# Patient Record
Sex: Male | Born: 1965 | Race: Black or African American | Hispanic: No | Marital: Married
Health system: Southern US, Community
[De-identification: ages and names within clinical notes are randomized; demographics above are authoritative.]

## PROBLEM LIST (undated history)

## (undated) DIAGNOSIS — M1990 Unspecified osteoarthritis, unspecified site: Secondary | ICD-10-CM

---

## 2017-05-25 ENCOUNTER — Emergency Department
Admission: EM | Admit: 2017-05-25 | Discharge: 2017-05-25 | Disposition: A | Discharge status: Home / Self Care | Payer: Federal, State, Local not specified - PPO | Attending: Emergency Medicine | Admitting: Emergency Medicine

## 2017-05-25 ENCOUNTER — Emergency Department: Payer: Federal, State, Local not specified - PPO

## 2017-05-25 ENCOUNTER — Other Ambulatory Visit: Payer: Self-pay

## 2017-05-25 ENCOUNTER — Encounter: Payer: Self-pay | Admitting: Medical Oncology

## 2017-05-25 DIAGNOSIS — R079 Chest pain, unspecified: Secondary | ICD-10-CM | POA: Insufficient documentation

## 2017-05-25 HISTORY — DX: Unspecified osteoarthritis, unspecified site: M19.90

## 2017-05-25 LAB — BASIC METABOLIC PANEL
Anion gap: 6 (ref 5–15)
BUN: 12 mg/dL (ref 6–20)
CALCIUM: 9.3 mg/dL (ref 8.9–10.3)
CO2: 27 mmol/L (ref 22–32)
CREATININE: 0.79 mg/dL (ref 0.61–1.24)
Chloride: 105 mmol/L (ref 101–111)
GFR calc non Af Amer: >60 mL/min (ref >60)
Glucose, Bld: 87 mg/dL (ref 65–99)
Potassium: 3.4 mmol/L — ABNORMAL LOW (ref 3.5–5.1)
Sodium: 138 mmol/L (ref 135–145)

## 2017-05-25 LAB — CBC
HCT: 43.4 % (ref 40.0–52.0)
Hemoglobin: 14.6 g/dL (ref 13.0–18.0)
MCH: 29.5 pg (ref 26.0–34.0)
MCHC: 33.6 g/dL (ref 32.0–36.0)
MCV: 87.7 fL (ref 80.0–100.0)
PLATELETS: 196 10*3/uL (ref 150–440)
RBC: 4.95 MIL/uL (ref 4.40–5.90)
RDW: 13.6 % (ref 11.5–14.5)
WBC: 6.8 10*3/uL (ref 3.8–10.6)

## 2017-05-25 LAB — TROPONIN I: Troponin I: <0.03 ng/mL (ref <0.03)

## 2017-05-25 IMAGING — CR DG CHEST 2V
1 series · 2 of 2 positions shown · non-contrast
Comparison: None.

CLINICAL DATA: Pt stated that he was experiencing pain in the
middle and left side of his chest that started about 3 days ago.

EXAM:
CHEST  2 VIEW

[Series 1: dg chest 2 view · 0.14mm/px · 2 of 2 slices shown]
[im 1/2]
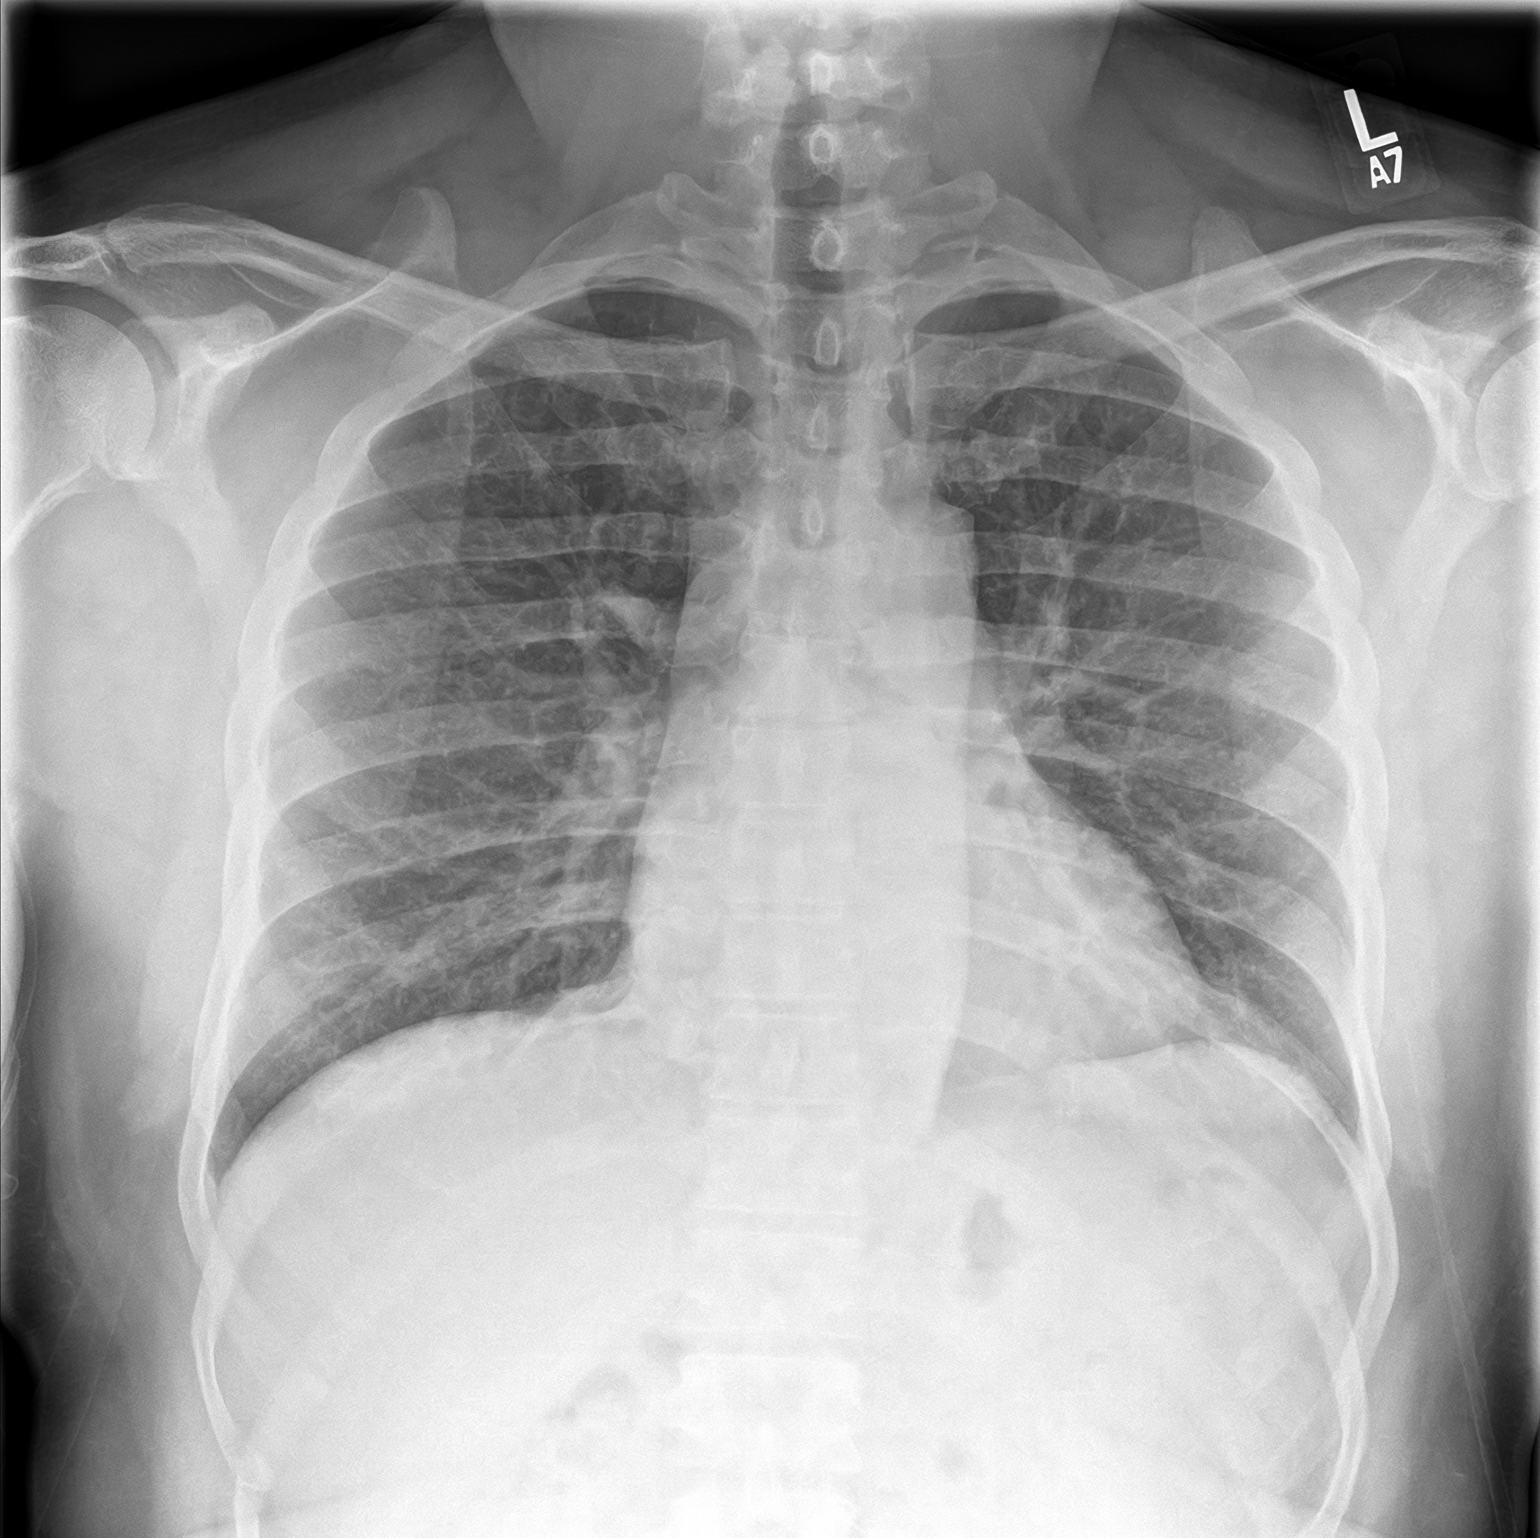
[im 2/2]
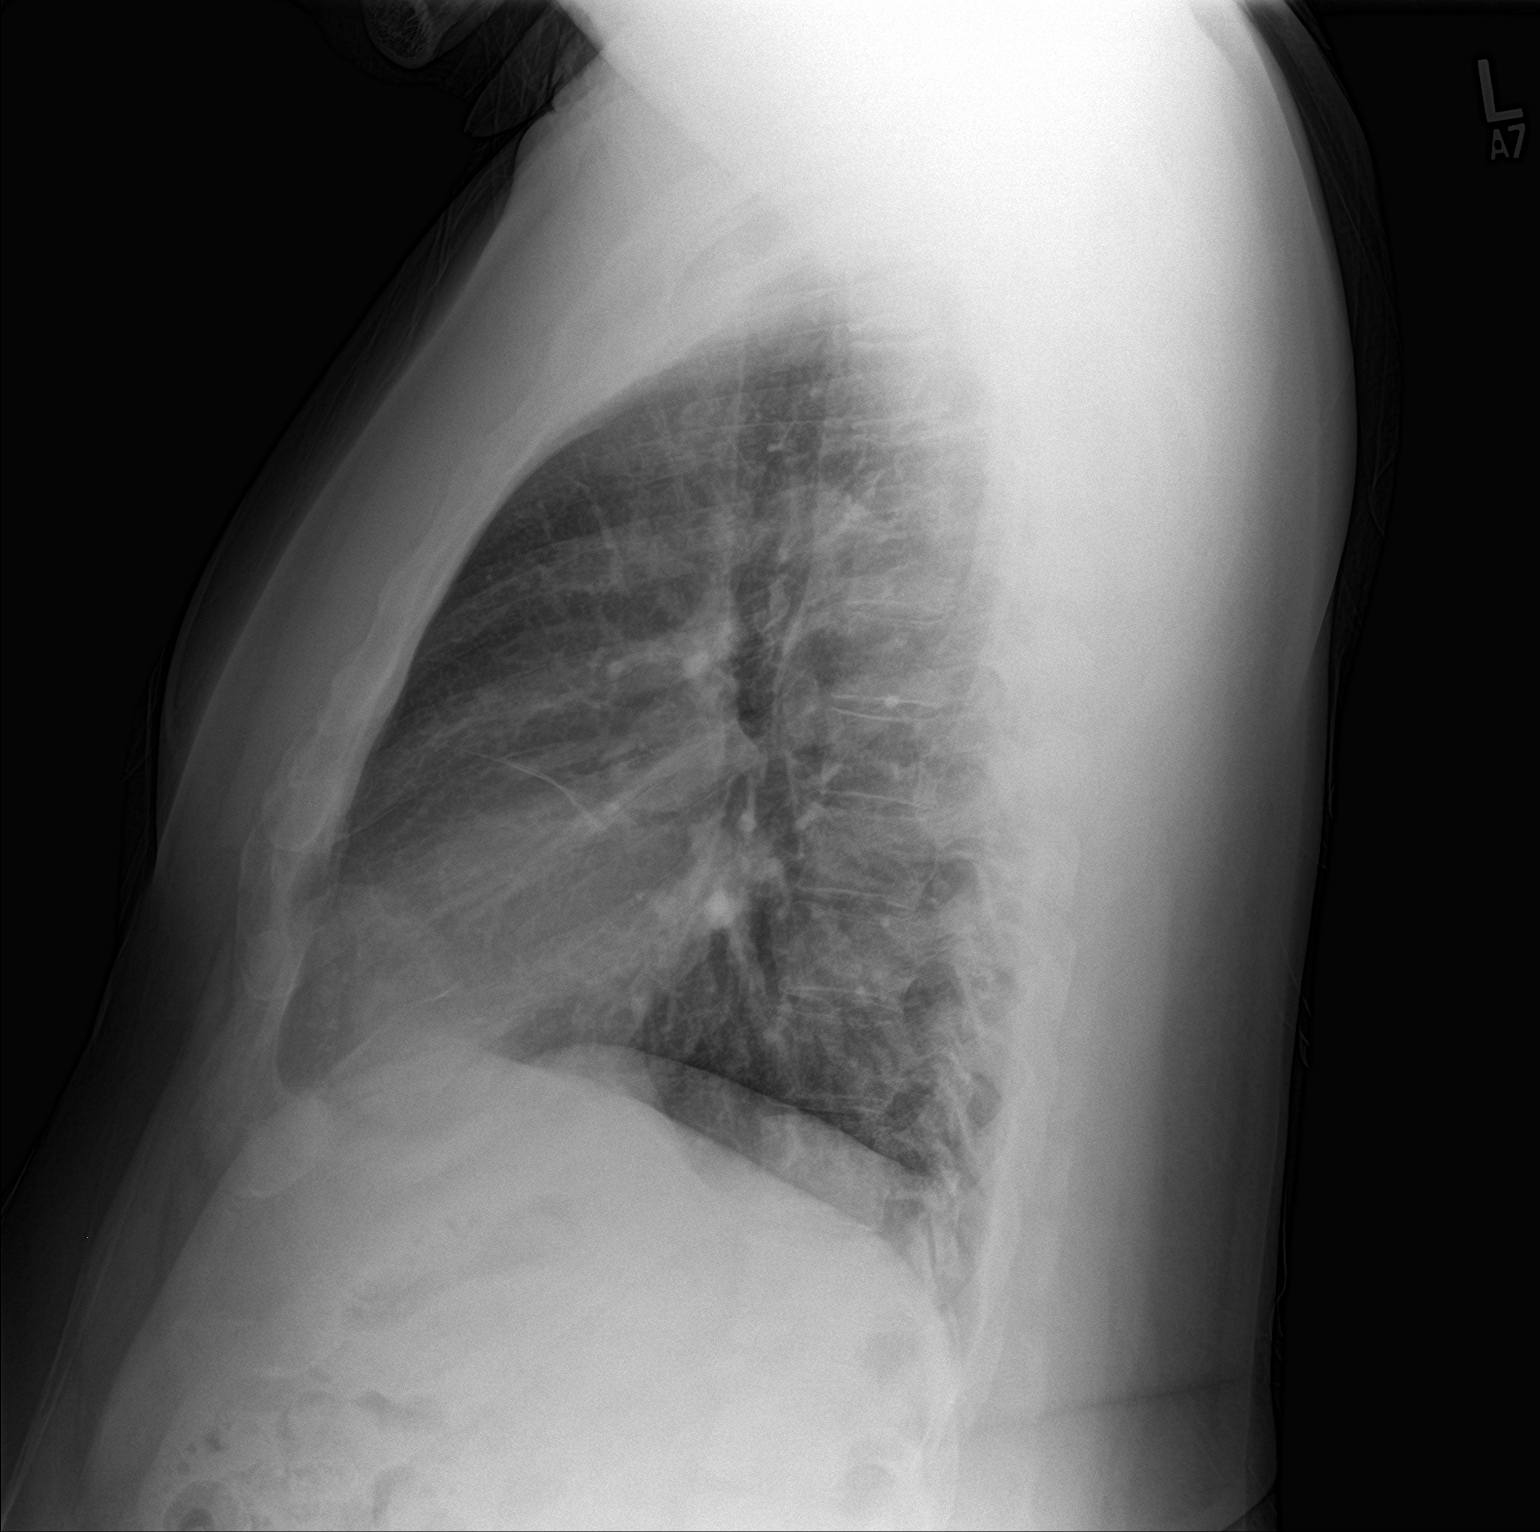

[2 of 2 positions shown; findings below may reference images not displayed]

FINDINGS: Cardiomediastinal silhouette is normal in size and configuration.
Lungs are clear. Lung volumes are normal. No evidence of pneumonia.
No pleural effusion. No pneumothorax.

Osseous and soft tissue structures about the chest are unremarkable.
IMPRESSION: No active cardiopulmonary disease. No evidence of pneumonia or
pulmonary edema.

## 2017-05-25 MED ORDER — ASPIRIN 81 MG PO TBEC: 81.0000 mg | DELAYED_RELEASE_TABLET | Freq: Every day | ORAL | 12 refills | Status: AC

## 2017-05-25 NOTE — ED Triage Notes (Signed)
Pt reports pain to center of chest that began about 3 days ago. Pain worsens with movement. Pt denies sob. Pt in NAD.

## 2017-05-25 NOTE — ED Provider Notes (Signed)
[facility] Emergency Department Provider Note       Time seen: ----------------------------------------- 2:19 PM on 05/25/2017 -----------------------------------------     I have reviewed the triage vital signs and the nursing notes.   HISTORY   Chief Complaint Chest Pain    HPI Dwayne Hudson is a 51 y.o. male who presents to the ED for chest pain that began about 3 days ago. Pain was worse last night, he describes it as an ache. Currently the pain is 3 out of 10 and is worse with movement. He denies sweats, nausea, shortness of breath. He has never had a history of this before.   Past Medical History:  Diagnosis Date  . Arthritis     There are no active problems to display for this patient.   No past surgical history on file.  Allergies Patient has no known allergies.  Social History Social History  Substance Use Topics  . Smoking status: Not on file  . Smokeless tobacco: Not on file  . Alcohol use Not on file    Review of Systems Constitutional: Negative for fever. Eyes: Negative for vision changes ENT:  Negative for congestion, sore throat Cardiovascular: Positive for chest pain Respiratory: Negative for shortness of breath. Gastrointestinal: Negative for abdominal pain, vomiting and diarrhea. Genitourinary: Negative for dysuria. Musculoskeletal: Negative for back pain. Skin: Negative for rash. Neurological: Negative for headaches, focal weakness or numbness.  All systems negative/normal/unremarkable except as stated in the HPI  ____________________________________________   PHYSICAL EXAM:  VITAL SIGNS: ED Triage Vitals [05/25/17 1155]  Enc Vitals Group     BP (!) 134/92     Pulse Rate 62     Resp 18     Temp 97.8 F (36.6 C)     Temp Source Oral     SpO2 98 %     Weight 245 lb (111.1 kg)     Height 5\' 9"  (1.753 m)     Head Circumference      Peak Flow      Pain Score 4     Pain Loc      Pain Edu?      Excl.  in GC?    Constitutional: Alert and oriented. Well appearing and in no distress. Eyes: Conjunctivae are normal. Normal extraocular movements. ENT   Head: Normocephalic and atraumatic.   Nose: No congestion/rhinnorhea.   Mouth/Throat: Mucous membranes are moist.   Neck: No stridor. Cardiovascular: Normal rate, regular rhythm. No murmurs, rubs, or gallops. Respiratory: Normal respiratory effort without tachypnea nor retractions. Breath sounds are clear and equal bilaterally. No wheezes/rales/rhonchi. Gastrointestinal: Soft and nontender. Normal bowel sounds Musculoskeletal: Nontender with normal range of motion in extremities. No lower extremity tenderness nor edema. Neurologic:  Normal speech and language. No gross focal neurologic deficits are appreciated.  Skin:  Skin is warm, dry and intact. No rash noted. Psychiatric: Mood and affect are normal. Speech and behavior are normal.  ____________________________________________  EKG: Interpreted by me. Sinus bradycardia rate of 49 bpm, nonspecific ST segment changes in 1 and aVL, normal QRS, normal QT.  Repeat EKG interpreted by me is unchanged. Likely early repolarization in 1 and aVL, sinus rhythm with a rate of 51 bpm, prolonged PR interval, normal QRS ____________________________________________  ED COURSE:  Pertinent labs & imaging results that were available during my care of the patient were reviewed by me and considered in my medical decision making (see chart for details). Patient presents for chest pain, we will assess with  labs and imaging as indicated.   Procedures ____________________________________________   LABS (pertinent positives/negatives)  Labs Reviewed  BASIC METABOLIC PANEL - Abnormal; Notable for the following:       Result Value   Potassium 3.4 (*)    All other components within normal limits  CBC  TROPONIN I  TROPONIN I    RADIOLOGY Images were viewed by me  Chest x-ray IMPRESSION: No  active cardiopulmonary disease. No evidence of pneumonia or pulmonary edema. ____________________________________________  FINAL ASSESSMENT AND PLAN  Chest pain  Plan: Patient's labs and imaging were dictated above. Patient had presented for Chest pain of uncertain etiology. He likely has early repolarization on his EKG. He'll be referred to cardiology for outpatient follow-up.   Emily FilbertWilliams, Carolynn Tuley E, MD   Note: This note was generated in part or whole with voice recognition software. Voice recognition is usually quite accurate but there are transcription errors that can and very often do occur. I apologize for any typographical errors that were not detected and corrected.     Emily FilbertWilliams, Diella Gillingham E, MD 05/25/17 [id number]

## 2017-05-25 NOTE — ED Notes (Signed)
ED Provider at bedside. 

## 2017-07-25 ENCOUNTER — Ambulatory Visit: Payer: Federal, State, Local not specified - PPO | Admitting: Internal Medicine

## 2017-07-25 NOTE — Progress Notes (Deleted)
   New Outpatient Visit Date: 07/25/2017  Referring Provider: Daryel NovemberJonathan Williams, MD [facility] Emergency Department  Chief Complaint: Chest pain  HPI:  [name] is a 51 y.o. male who is being seen today for the evaluation of chest pain at the request of Dr. Mayford KnifeWilliams. He has a history of arthritis. [facility] on 05/25/17 with a 3-day history of chest pain without accompanying symptoms. EKG showed lateral ST segment elevation consistent with early repolarization. Troponin was negative x 2. No clear etiology for his chest pain was identified.  --------------------------------------------------------------------------------------------------  Cardiovascular History & Procedures: Cardiovascular Problems:  Chest pain  Risk Factors:  None  Cath/PCI:  None  CV Surgery:  None  EP Procedures and Devices:  None  Non-Invasive Evaluation(s):  None  Recent CV Pertinent Labs: Lab Results  Component Value Date   K 3.4 (L) 05/25/2017   BUN 12 05/25/2017   CREATININE 0.79 05/25/2017    --------------------------------------------------------------------------------------------------  Past Medical History:  Diagnosis Date  . Arthritis     No past surgical history on file.  No outpatient prescriptions have been marked as taking for the 07/25/17 encounter (Appointment) with Leoda Smithhart, Cristal Deerhristopher, MD.    Allergies: Patient has no known allergies.  Social History   Social History  . Marital status: Married    Spouse name: N/A  . Number of children: N/A  . Years of education: N/A   Occupational History  . Not on file.   Social History Main Topics  . Smoking status: Not on file  . Smokeless tobacco: Not on file  . Alcohol use Not on file  . Drug use: Unknown  . Sexual activity: Not on file   Other Topics Concern  . Not on file   Social History Narrative  . No narrative on file    No family history on file.  Review of Systems: A 12-system review of  systems was performed and was negative except as noted in the HPI.  --------------------------------------------------------------------------------------------------  Physical Exam: There were no vitals taken for this visit.  General:  *** HEENT: No conjunctival pallor or scleral icterus. Moist mucous membranes. OP clear. Neck: Supple without lymphadenopathy, thyromegaly, JVD, or HJR. No carotid bruit. Lungs: Normal work of breathing. Clear to auscultation bilaterally without wheezes or crackles. Heart: Regular rate and rhythm without murmurs, rubs, or gallops. Non-displaced PMI. Abd: Bowel sounds present. Soft, NT/ND without hepatosplenomegaly Ext: No lower extremity edema. Radial, PT, and DP pulses are 2+ bilaterally Skin: Warm and dry without rash. Neuro: CNIII-XII intact. Strength and fine-touch sensation intact in upper and lower extremities bilaterally. Psych: Normal mood and affect.  EKG:  ***  Lab Results  Component Value Date   WBC 6.8 05/25/2017   HGB 14.6 05/25/2017   HCT 43.4 05/25/2017   MCV 87.7 05/25/2017   PLT 196 05/25/2017    Lab Results  Component Value Date   NA 138 05/25/2017   K 3.4 (L) 05/25/2017   CL 105 05/25/2017   CO2 27 05/25/2017   BUN 12 05/25/2017   CREATININE 0.79 05/25/2017   GLUCOSE 87 05/25/2017    No results found for: CHOL, HDL, LDLCALC, LDLDIRECT, TRIG, CHOLHDL   --------------------------------------------------------------------------------------------------  ASSESSMENT AND PLAN: ***  [name], MD 07/25/2017 8:46 AM

## 2017-07-26 ENCOUNTER — Encounter: Payer: Self-pay | Admitting: Internal Medicine

## 2022-02-13 ENCOUNTER — Other Ambulatory Visit: Payer: Self-pay | Admitting: Internal Medicine

## 2022-02-13 DIAGNOSIS — M25511 Pain in right shoulder: Secondary | ICD-10-CM

## 2022-02-13 DIAGNOSIS — M25512 Pain in left shoulder: Secondary | ICD-10-CM

## 2022-02-23 ENCOUNTER — Ambulatory Visit
Admission: RE | Admit: 2022-02-23 | Discharge: 2022-02-23 | Disposition: A | Discharge status: Home / Self Care | Payer: No Typology Code available for payment source | Source: Ambulatory Visit | Attending: Internal Medicine | Admitting: Internal Medicine

## 2022-02-23 ENCOUNTER — Other Ambulatory Visit: Payer: Federal, State, Local not specified - PPO

## 2022-02-23 DIAGNOSIS — M25511 Pain in right shoulder: Secondary | ICD-10-CM | POA: Insufficient documentation

## 2022-02-23 DIAGNOSIS — M25512 Pain in left shoulder: Secondary | ICD-10-CM | POA: Insufficient documentation

## 2022-02-23 IMAGING — MR MR SHOULDER*L* W/O CM
5 of 6 series · 30 of 40 positions shown · non-contrast
Comparison: None.

CLINICAL DATA: Left shoulder pain with limited range of motion
since [4H], and notes pain is getting worse.

EXAM:
MRI OF THE LEFT SHOULDER WITHOUT CONTRAST
TECHNIQUE: Multiplanar, multisequence MR imaging of the shoulder was performed.
No intravenous contrast was administered.

[Series 5: T2 fat-sat · axial · left · 4.0mm · 0.44mm/px · z∈[-53,+67]mm · 7 of 26 slices shown (1 of 4)]
[im 1/26]
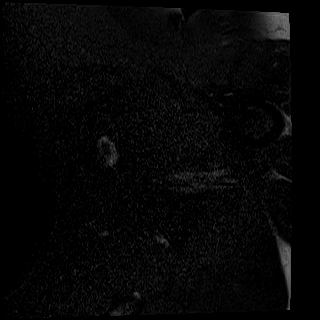
[im 5/26]
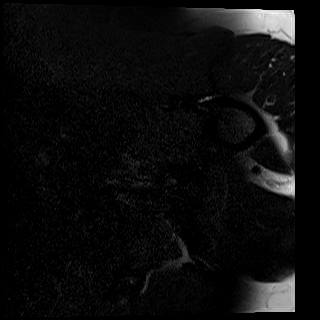
[im 9/26]
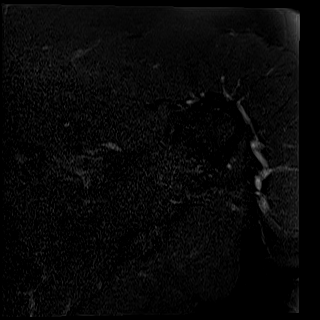
[im 13/26]
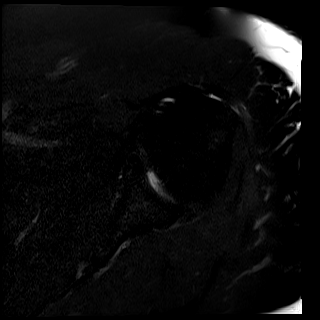
[im 17/26]
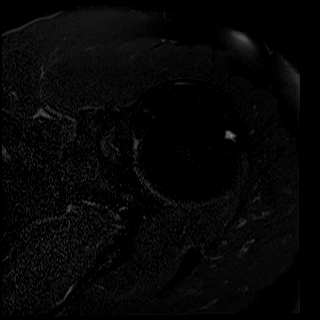
[im 21/26]
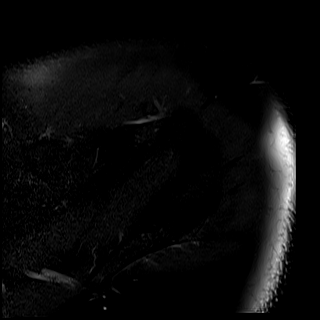
[im 26/26]
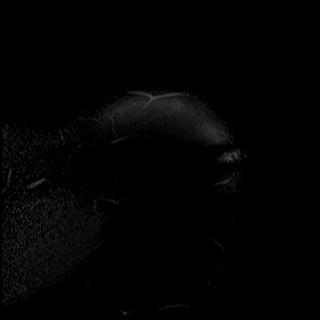

[Series 6: PD · oblique · left · 4.0mm · 0.44mm/px · 7 of 26 slices shown]
[im 1/26]
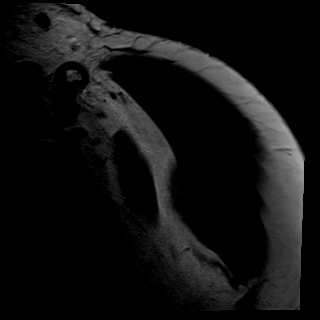
[im 5/26]
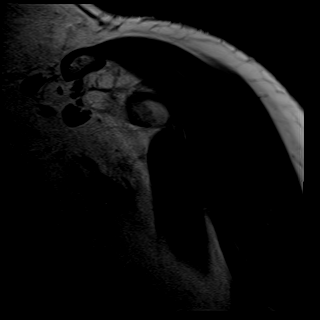
[im 9/26]
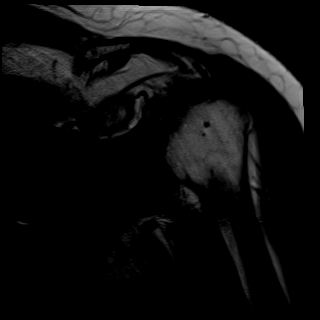
[im 13/26]
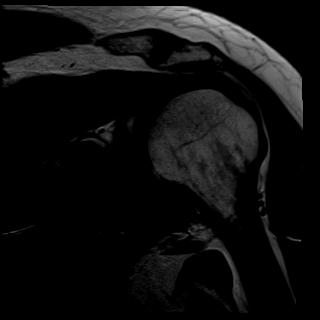
[im 17/26]
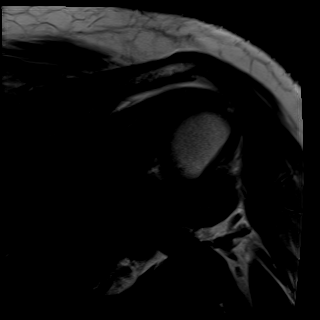
[im 21/26]
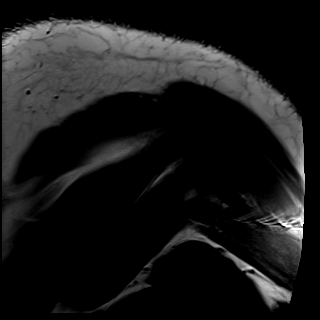
[im 26/26]
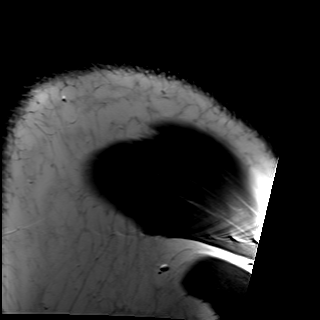

[Series 7: T2 fat-sat · oblique · left · 4.0mm · 0.44mm/px · 7 of 26 slices shown (2 of 4)]
[im 1/26]
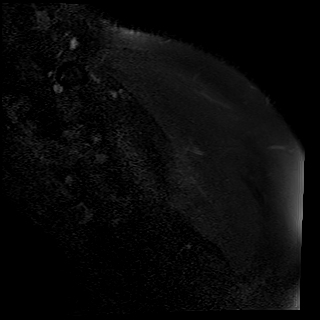
[im 5/26]
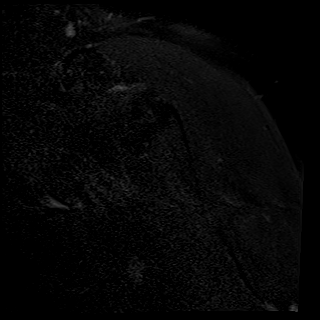
[im 9/26]
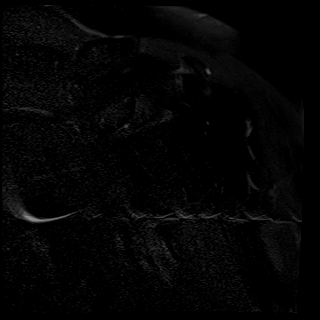
[im 13/26]
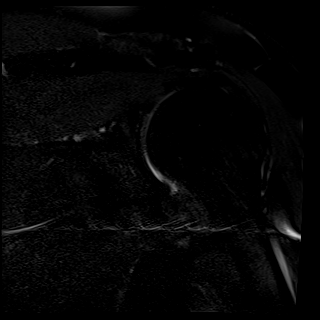
[im 17/26]
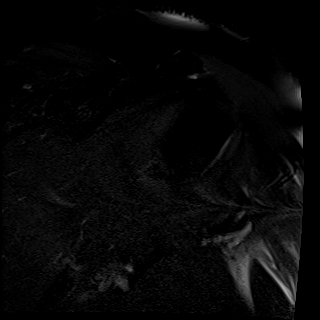
[im 21/26]
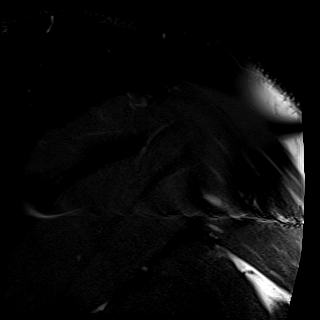
[im 26/26]
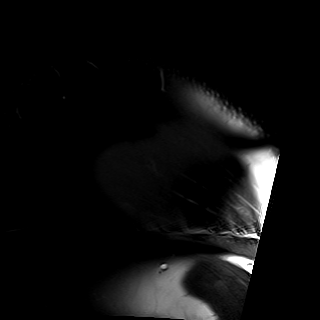

[Series 8: T2 fat-sat · oblique · left · 4.0mm · 0.22mm/px · 6 of 22 slices shown (3 of 4)]
[im 1/22]
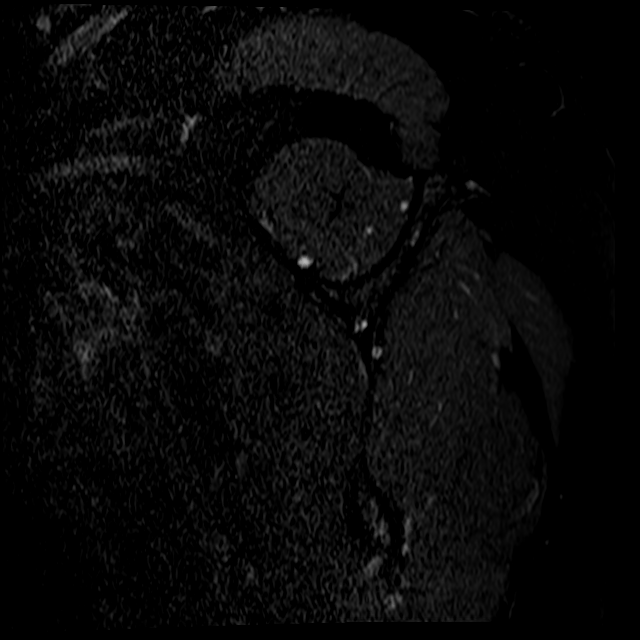
[im 5/22]
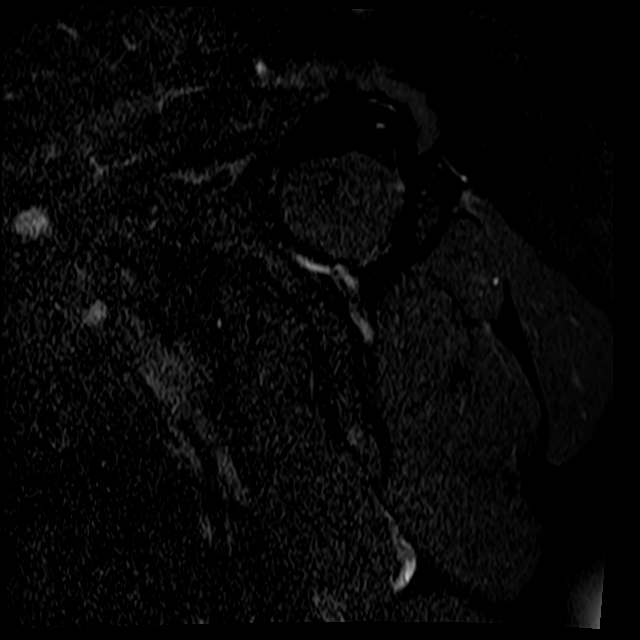
[im 9/22]
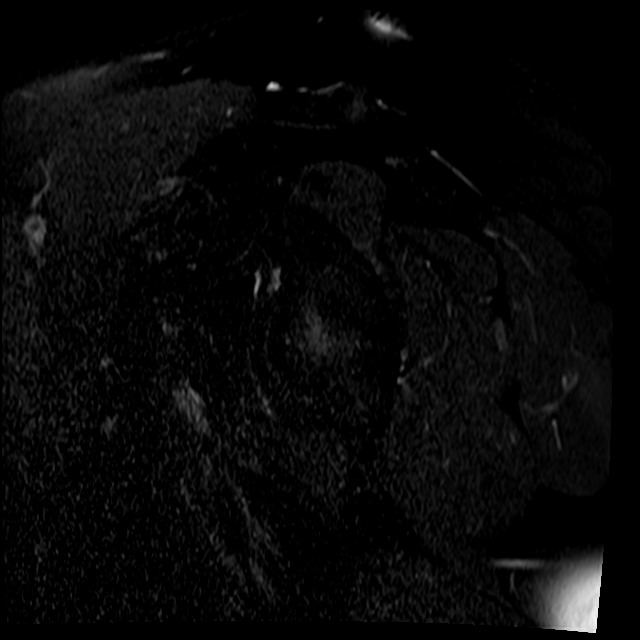
[im 13/22]
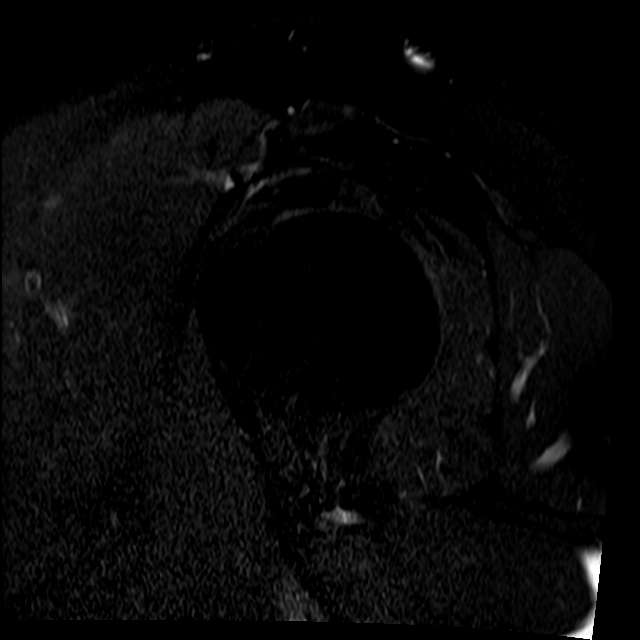
[im 17/22]
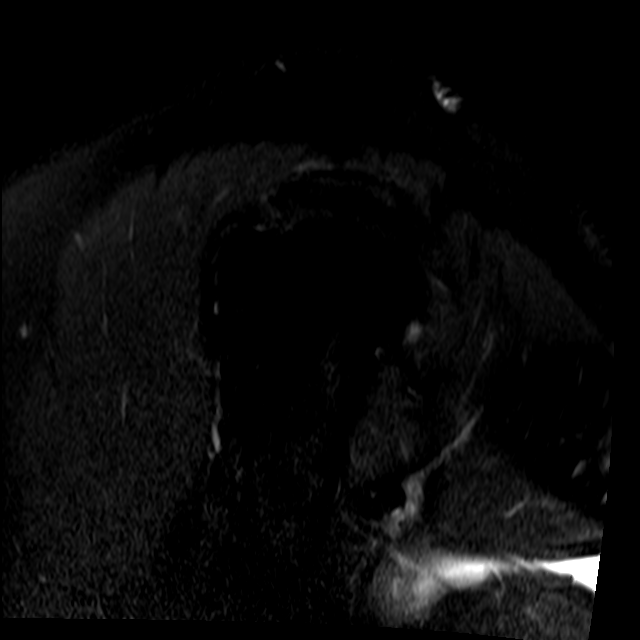
[im 22/22]
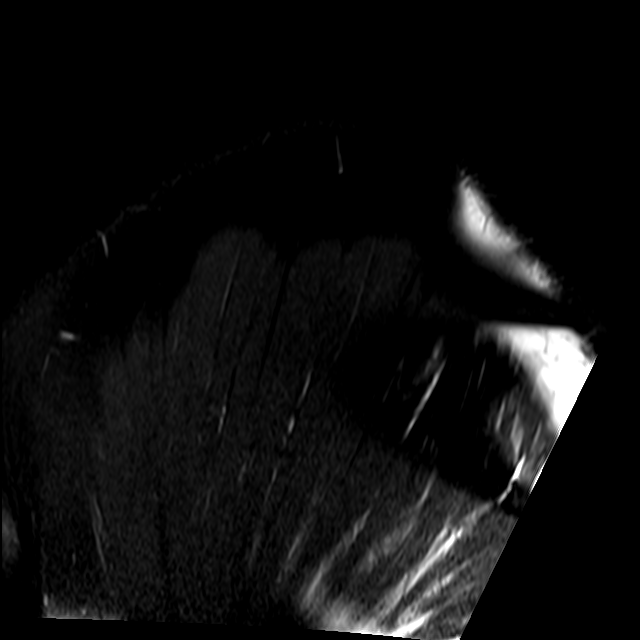

[Series 10: T2 fat-sat · oblique · left · 4.0mm · 0.44mm/px · 3 of 26 slices shown (4 of 4)]
[im 1/26]
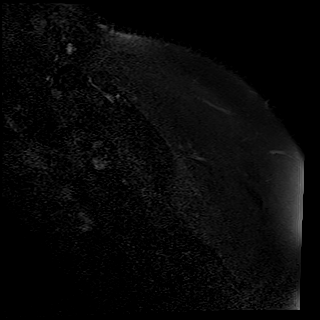
[im 5/26]
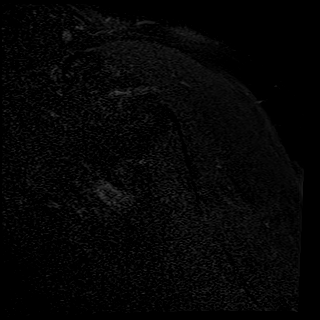
[im 9/26]
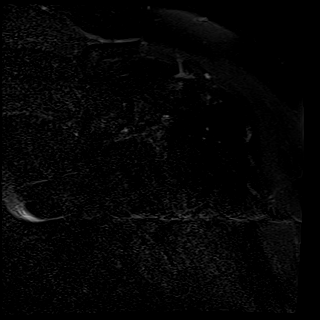

[30 of 40 positions shown; findings below may reference images not displayed]

FINDINGS: Rotator cuff: There is mild intermediate T2 signal tendinosis of the
posterior supraspinatus and anterior infraspinatus tendon fibers.
There is mild cystic change within the humeral head deep to the
anterior supraspinatus tendon insertion with likely minimal
partial-thickness degenerative fraying of the anterior supraspinatus
tendon (sagittal images 16 and 17, coronal image 17). The
infraspinatus, subscapularis, and teres minor are intact.

Muscles: No rotator cuff muscle atrophy, fatty infiltration, or
edema.

Biceps long head: The intra-articular long head of the biceps tendon
is intact.

Acromioclavicular Joint: There are minimal degenerative changes of
the acromioclavicular joint including joint space narrowing,
subchondral marrow edema, and peripheral osteophytosis. Type II
acromion. Minimal distal lateral subacromial spurring. No
subacromial/subdeltoid bursitis.

Glenohumeral Joint: Mild glenoid and humeral head cartilage
thinning.

Labrum: Grossly intact, but evaluation is limited by lack of
intraarticular fluid.

Bones:  No acute fracture.

Other: None.
IMPRESSION: :
IMPRESSION: 1. Mild posterior supraspinatus and anterior infraspinatus
insertional tendinosis. Minimal partial-thickness degenerative
fraying of the anterior supraspinatus tendon footprint.
2. Minimal degenerative changes of the acromioclavicular joint.

## 2022-02-23 IMAGING — MR MR SHOULDER*R* W/O CM
4 of 5 series · 31 of 40 positions shown · non-contrast
Comparison: None.

CLINICAL DATA: Right shoulder pain with limited range of motion
since [ZF], and notes pain is getting worse.

EXAM:
MRI OF THE RIGHT SHOULDER WITHOUT CONTRAST
TECHNIQUE: Multiplanar, multisequence MR imaging of the shoulder was performed.
No intravenous contrast was administered.

[Series 5: T2 fat-sat · axial · right · 4.0mm · 0.44mm/px · z∈[-48,+72]mm · 8 of 26 slices shown (1 of 3)]
[im 1/26]
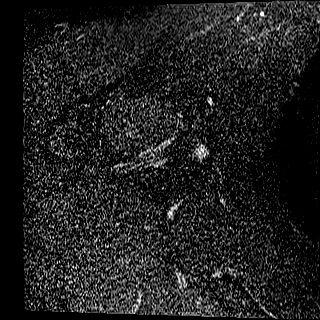
[im 4/26]
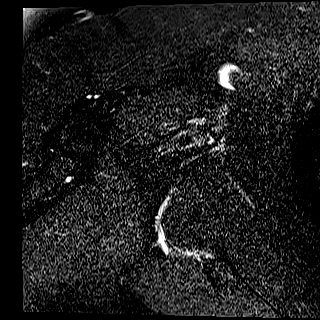
[im 8/26]
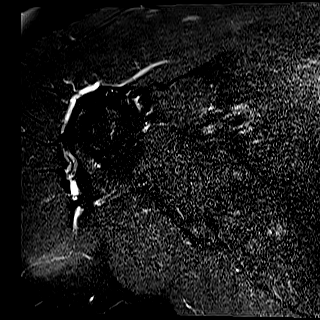
[im 11/26]
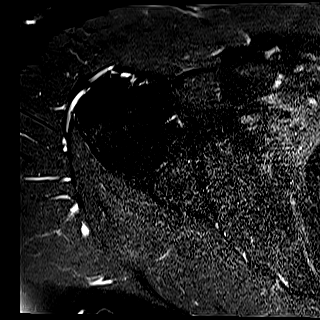
[im 15/26]
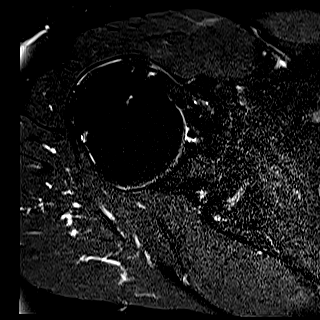
[im 18/26]
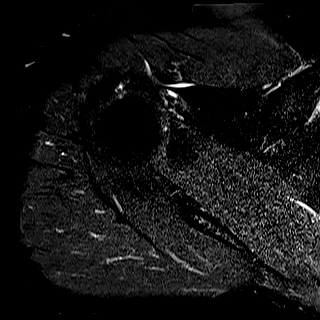
[im 22/26]
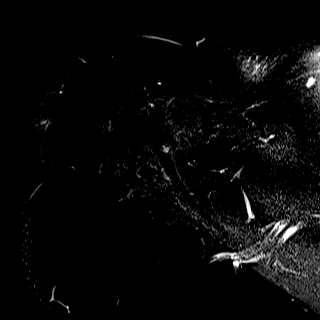
[im 26/26]
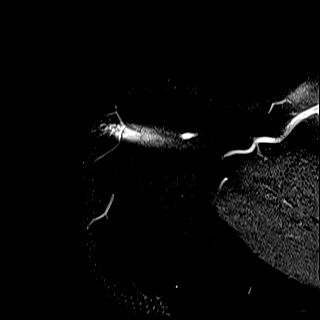

[Series 6: PD · oblique · right · 4.0mm · 0.44mm/px · 9 of 26 slices shown]
[im 1/26]
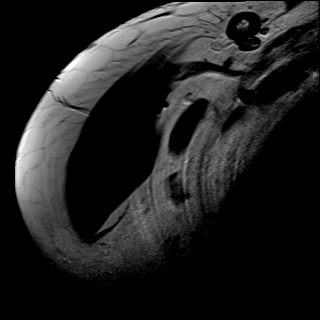
[im 4/26]
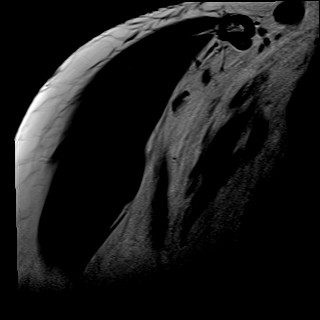
[im 7/26]
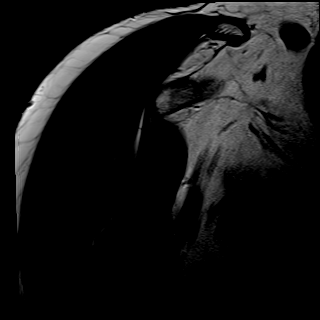
[im 10/26]
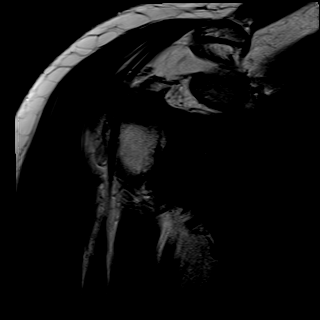
[im 13/26]
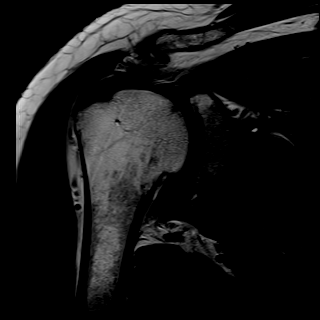
[im 16/26]
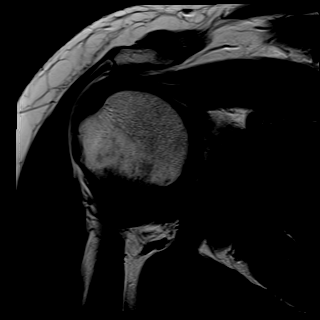
[im 19/26]
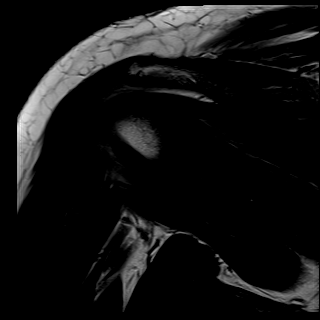
[im 22/26]
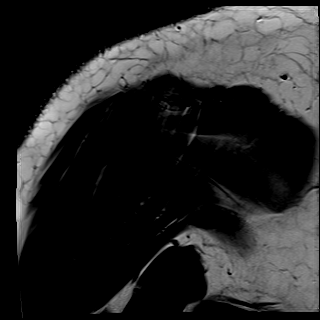
[im 26/26]
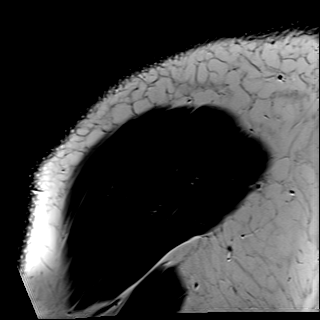

[Series 7: T2 fat-sat · oblique · right · 4.0mm · 0.44mm/px · 9 of 26 slices shown (2 of 3)]
[im 1/26]
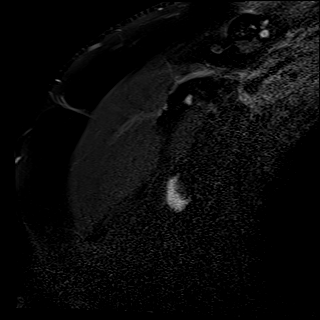
[im 4/26]
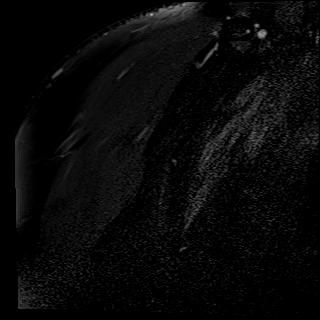
[im 7/26]
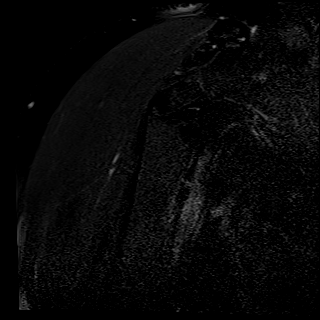
[im 10/26]
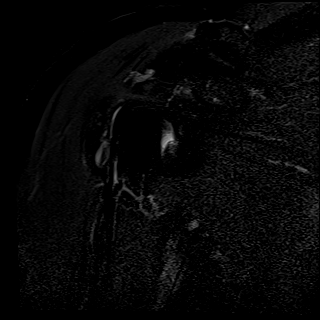
[im 13/26]
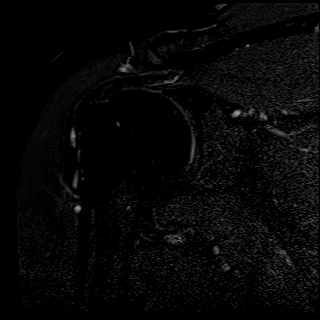
[im 16/26]
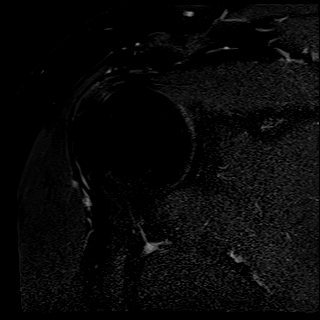
[im 19/26]
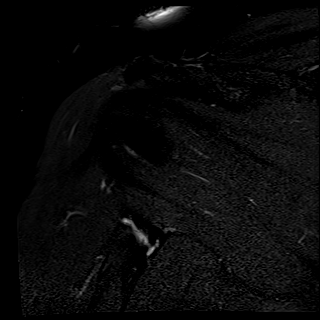
[im 22/26]
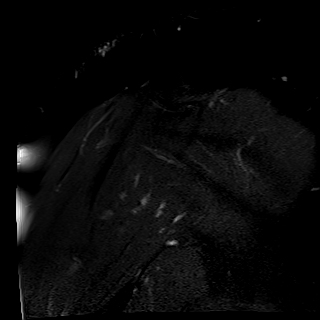
[im 26/26]
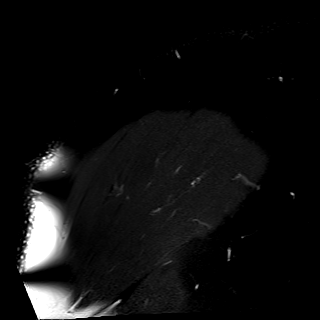

[Series 8: T2 fat-sat · oblique · right · 4.0mm · 0.22mm/px · 5 of 22 slices shown (3 of 3)]
[im 1/22]
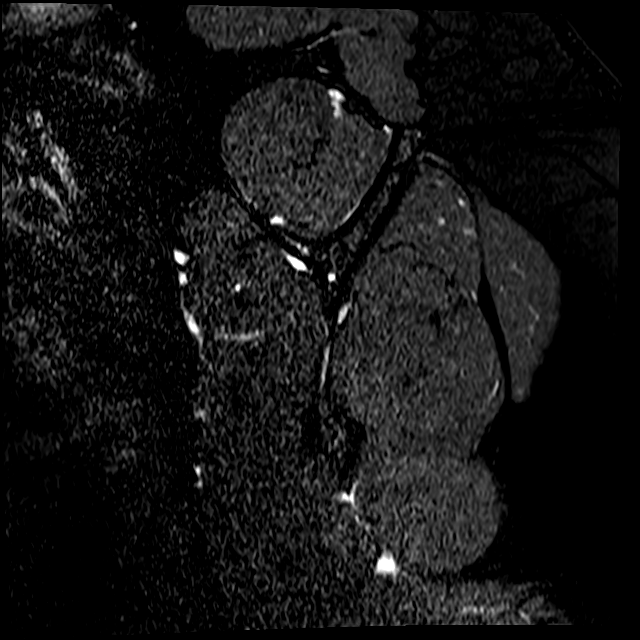
[im 4/22]
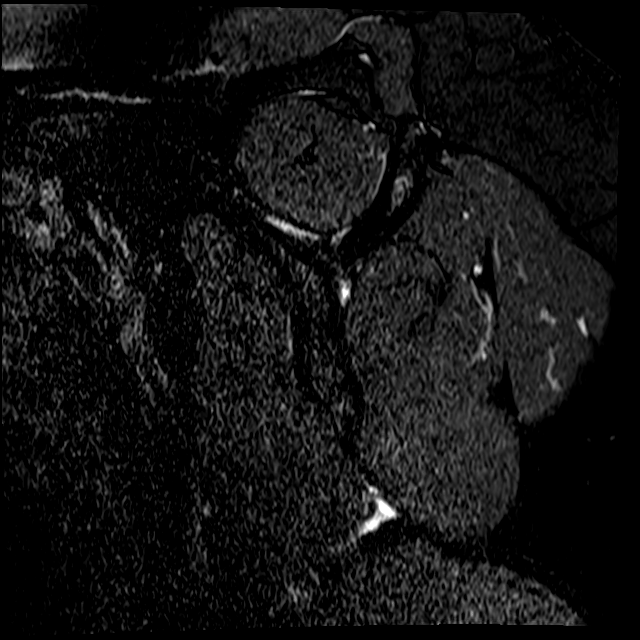
[im 8/22]
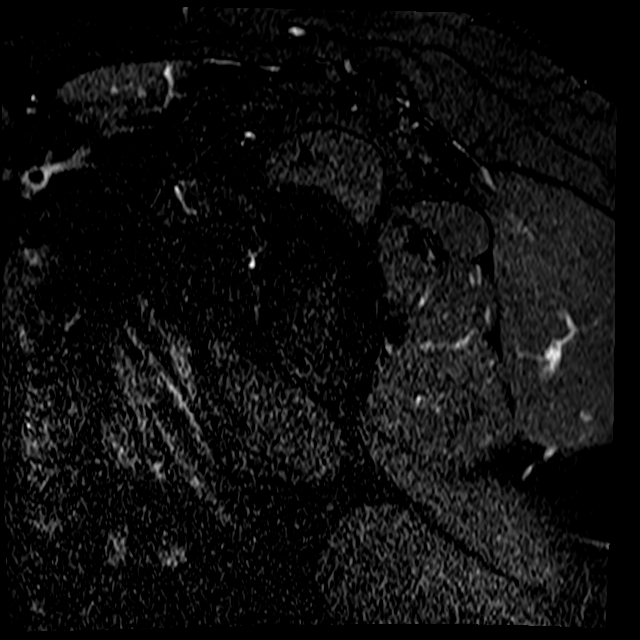
[im 11/22]
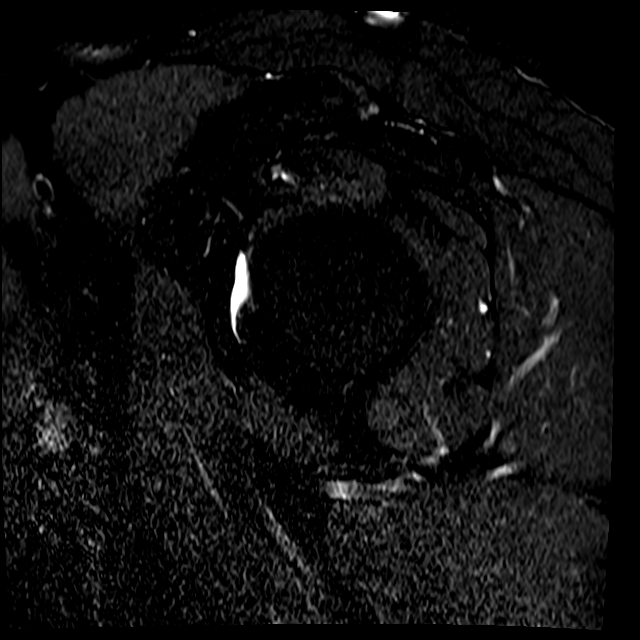
[im 18/22]
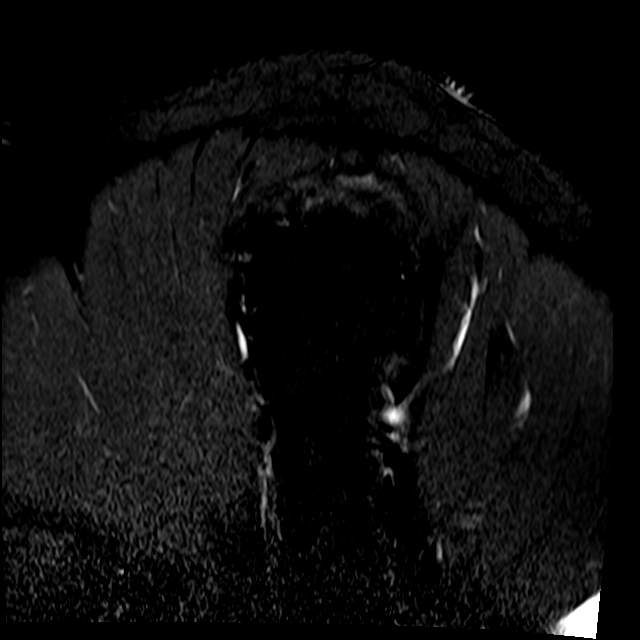

[31 of 40 positions shown; findings below may reference images not displayed]

FINDINGS: Rotator cuff: Moderate tendinosis of the supraspinatus tendon with a
tiny insertional interstitial tear. Moderate tendinosis of the
infraspinatus tendon. Teres minor tendon is intact. Subscapularis
tendon is intact.

Muscles: No muscle atrophy or edema. No intramuscular fluid
collection or hematoma.

Biceps Long Head: Intraarticular and extraarticular portions of the
biceps tendon are intact.

Acromioclavicular Joint: Mild arthropathy of the acromioclavicular
joint. No subacromial/subdeltoid bursal fluid.

Glenohumeral Joint: No joint effusion. No chondral defect.

Labrum: Grossly intact, but evaluation is limited by lack of
intraarticular fluid/contrast.

Bones: No fracture or dislocation. No aggressive osseous lesion.

Other: No fluid collection or hematoma.
IMPRESSION: 1. Moderate tendinosis of the supraspinatus tendon with a tiny
insertional interstitial tear.
2. Moderate tendinosis of the infraspinatus tendon.

## 2022-04-13 ENCOUNTER — Other Ambulatory Visit: Payer: Self-pay | Admitting: Orthopedic Surgery

## 2022-04-13 DIAGNOSIS — M25562 Pain in left knee: Secondary | ICD-10-CM

## 2022-04-18 ENCOUNTER — Other Ambulatory Visit: Payer: Self-pay | Admitting: Internal Medicine

## 2022-04-18 DIAGNOSIS — M545 Low back pain, unspecified: Secondary | ICD-10-CM

## 2022-05-01 ENCOUNTER — Ambulatory Visit
Admission: RE | Admit: 2022-05-01 | Discharge: 2022-05-01 | Disposition: A | Discharge status: Home / Self Care | Payer: No Typology Code available for payment source | Source: Ambulatory Visit | Attending: Orthopedic Surgery | Admitting: Orthopedic Surgery

## 2022-05-01 ENCOUNTER — Ambulatory Visit
Admission: RE | Admit: 2022-05-01 | Discharge: 2022-05-01 | Disposition: A | Discharge status: Home / Self Care | Payer: No Typology Code available for payment source | Source: Ambulatory Visit | Attending: Internal Medicine | Admitting: Internal Medicine

## 2022-05-01 DIAGNOSIS — M25561 Pain in right knee: Secondary | ICD-10-CM

## 2022-05-01 DIAGNOSIS — M545 Low back pain, unspecified: Secondary | ICD-10-CM | POA: Insufficient documentation

## 2022-05-01 DIAGNOSIS — M25562 Pain in left knee: Secondary | ICD-10-CM | POA: Diagnosis present

## 2022-05-01 IMAGING — MR MR LUMBAR SPINE W/O CM
5 series · 31 of 48 positions shown · non-contrast
Comparison: None

CLINICAL DATA: Chronic low back pain over the last 10 years.
History of back injury in [01]. Pain radiating to both legs.

EXAM:
MRI LUMBAR SPINE WITHOUT CONTRAST
TECHNIQUE: Multiplanar, multisequence MR imaging of the lumbar spine was
performed. No intravenous contrast was administered.

[Series 5: T2 · sagittal · 4.0mm · 0.81mm/px · 6 of 17 slices shown (1 of 2)]
[im 1/17]
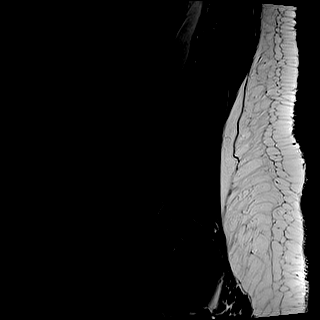
[im 4/17]
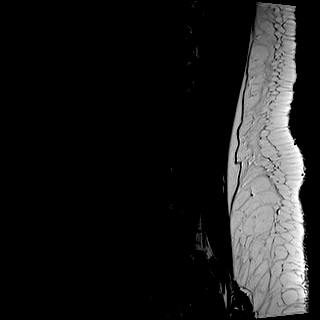
[im 7/17]
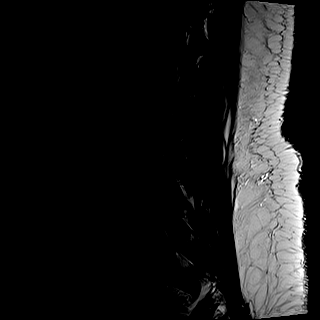
[im 10/17]
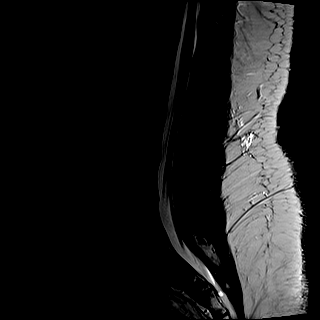
[im 13/17]
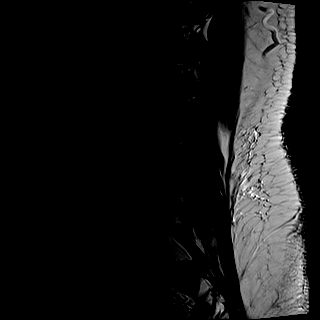
[im 17/17]
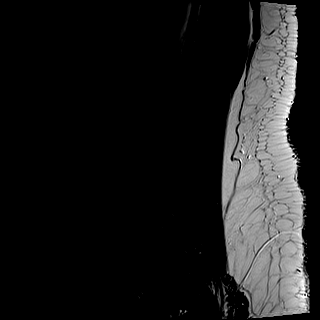

[Series 6: T1 · sagittal · 4.0mm · 0.81mm/px · 7 of 17 slices shown (1 of 2)]
[im 1/17]
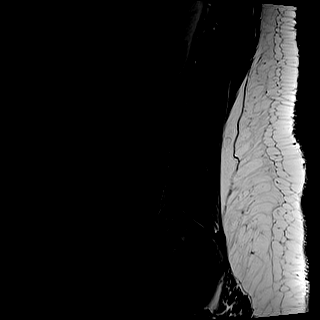
[im 3/17]
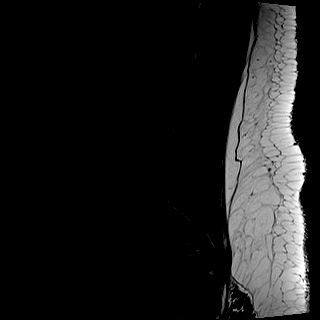
[im 6/17]
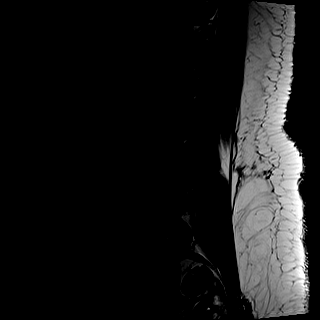
[im 9/17]
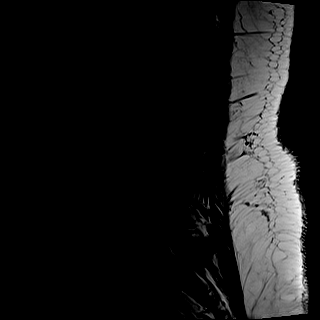
[im 11/17]
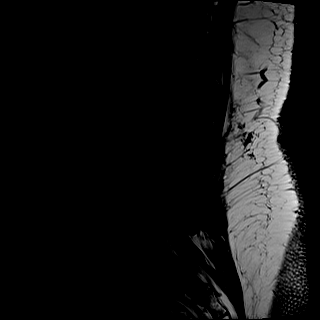
[im 14/17]
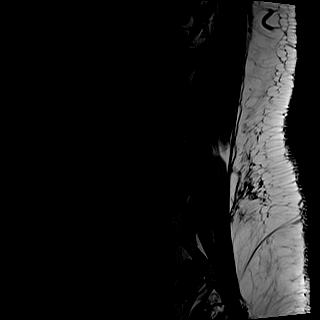
[im 17/17]
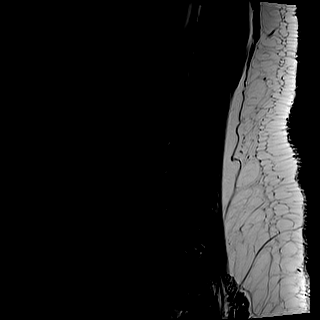

[Series 7: STIR · sagittal · 4.0mm · 0.41mm/px · 2 of 17 slices shown]
[im 1/17]
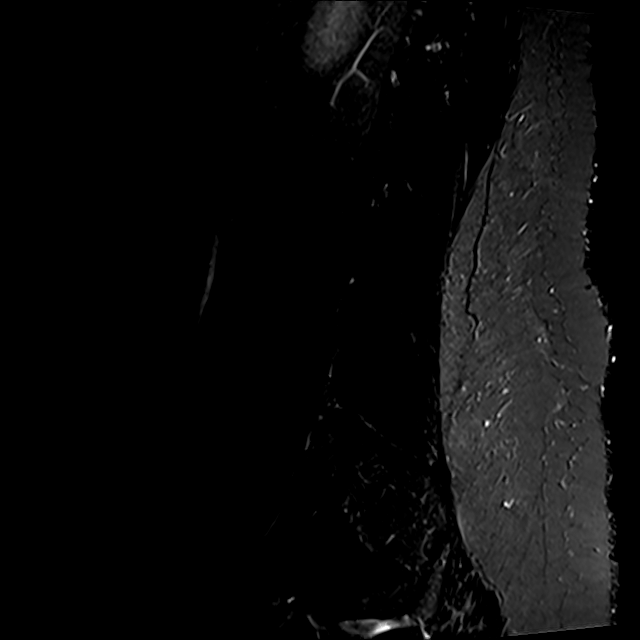
[im 3/17]
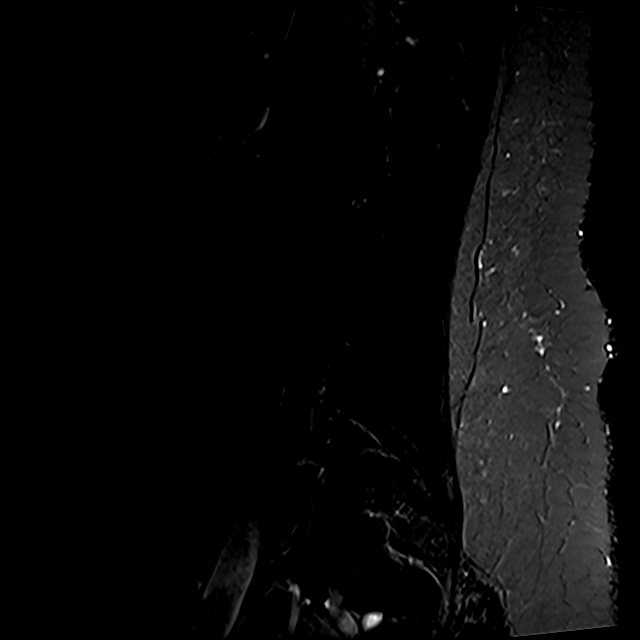

[Series 8: T2 · axial · 4.0mm · 0.78mm/px · z∈[-144,+69]mm · 8 of 36 slices shown (2 of 2)]
[im 1/36]
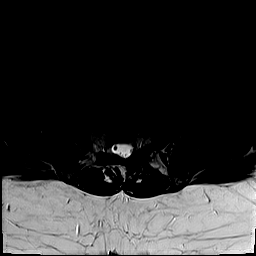
[im 6/36]
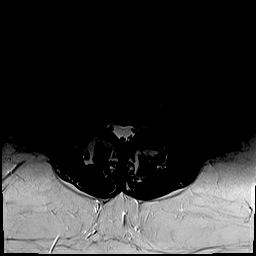
[im 11/36]
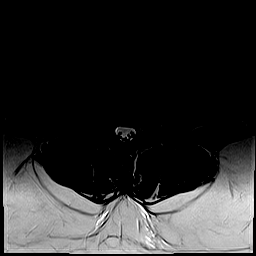
[im 17/36]
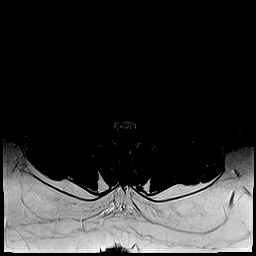
[im 19/36]
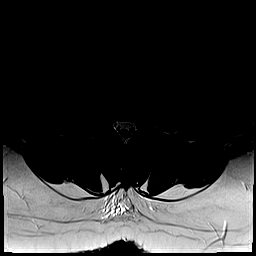
[im 25/36]
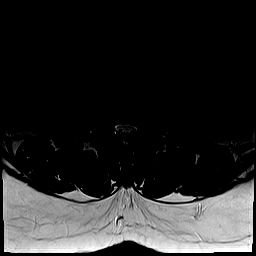
[im 30/36]
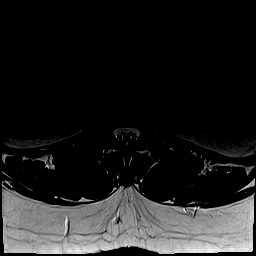
[im 36/36]
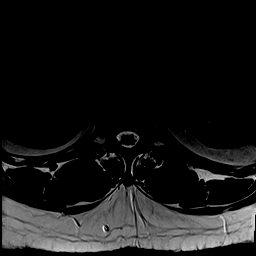

[Series 9: T1 · axial · 4.0mm · 0.39mm/px · z∈[-144,+69]mm · 8 of 36 slices shown (2 of 2)]
[im 1/36]
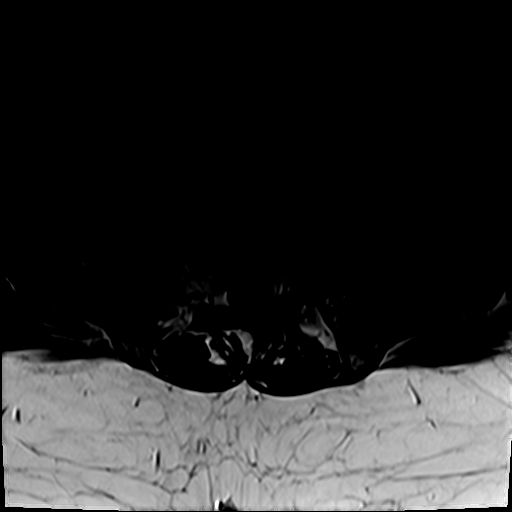
[im 6/36]
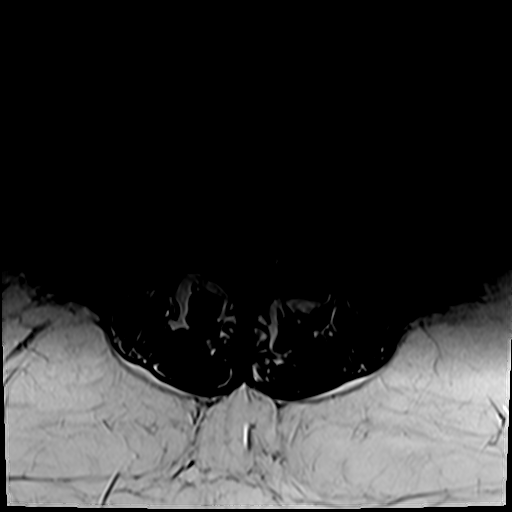
[im 11/36]
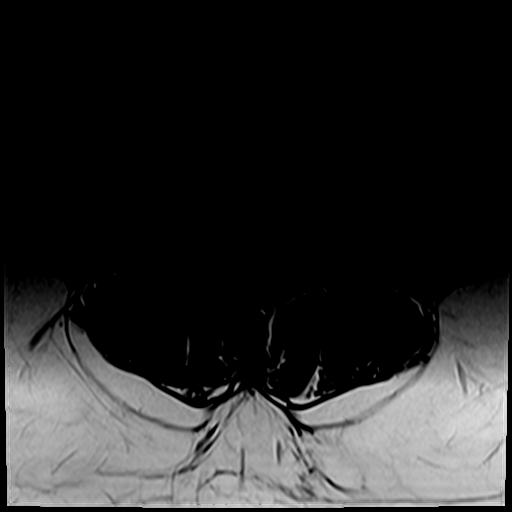
[im 17/36]
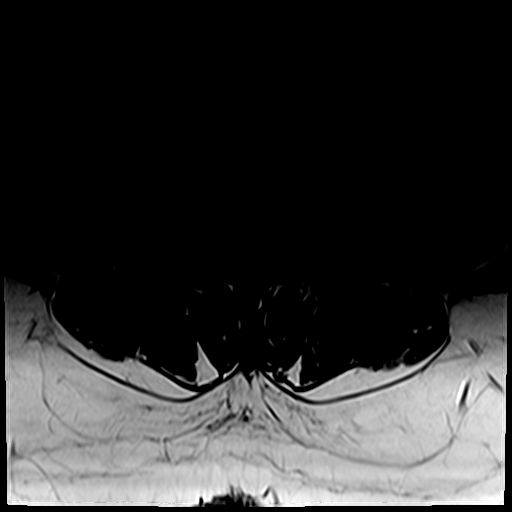
[im 19/36]
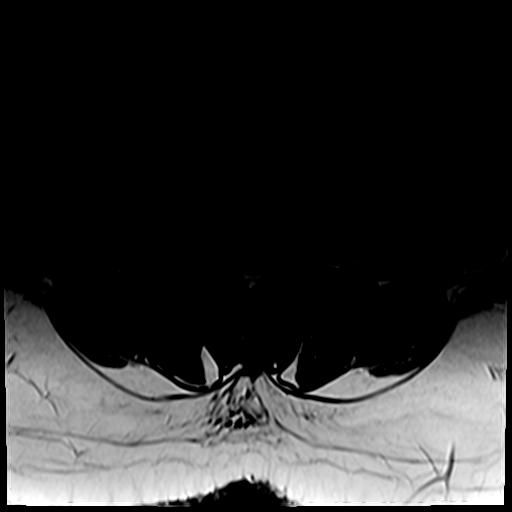
[im 25/36]
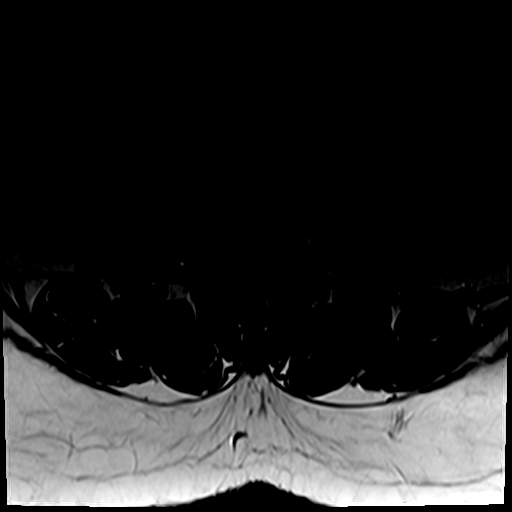
[im 30/36]
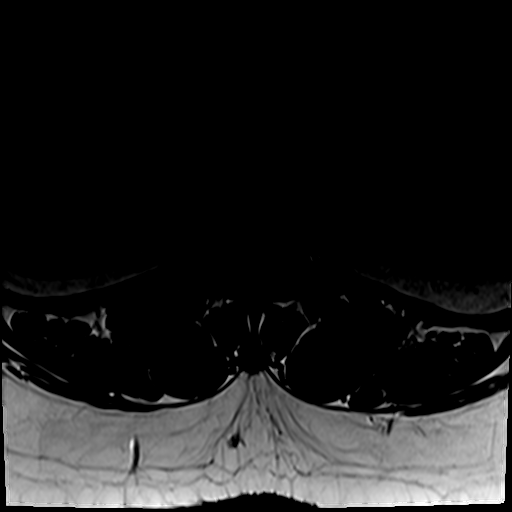
[im 36/36]
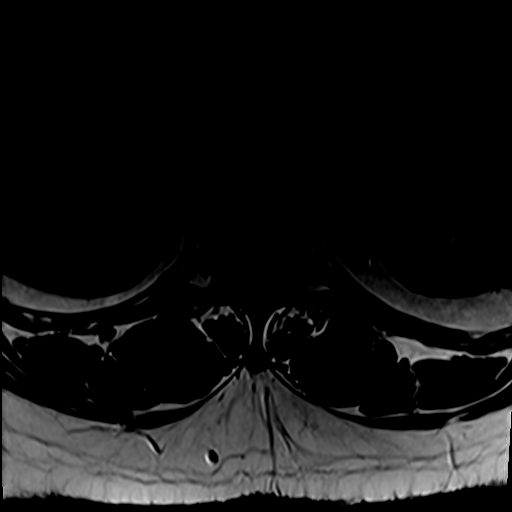

[31 of 48 positions shown; findings below may reference images not displayed]

FINDINGS: Segmentation:  5 lumbar type vertebral bodies assumed.

Alignment:  Normal

Vertebrae:  No fracture or focal bone lesion.

Conus medullaris and cauda equina: Conus extends to the L1 level.
Conus and cauda equina appear normal.

Paraspinal and other soft tissues: Negative

Disc levels:

No abnormality from T12-L1 through L2-3.

L3-4: Minimal desiccation and bulging of the disc. No stenosis or
neural compression.

L4-5: Minimal desiccation and bulging of the disc. This is slightly
prominent in the left extraforaminal region. No visible neural
compression. Changes however. Mild facet osteoarthritis without
frank edema.

L5-S1: No disc degeneration. No canal or foraminal stenosis.
Congenital asymmetry of the posterior elements with a hypoplastic
facet joint on the right. No evidence of subsequent degenerative
IMPRESSION: No advanced or definitely symptomatic finding.

Minimal non-compressive disc bulges at L3-4 and L4-5. At L4-5, there
is mild prominence in the left extraforaminal region, but this does
not appear to have any compressive effect upon the adjacent L4
nerve.

Mild facet osteoarthritis at L4-5 without gross edema.

Congenital asymmetry of the posterior elements at L5-S1 with a
hypoplastic facet joint on the right. There do not appear to be any
significant compensatory degenerative changes in the region however.
